# Patient Record
Sex: Female | Born: 1980 | Race: Asian | Hispanic: No | Marital: Married | Smoking: Never smoker
Health system: Southern US, Community
[De-identification: ages and names within clinical notes are randomized; demographics above are authoritative.]

---

## 2014-04-17 LAB — HM PAP SMEAR: HM Pap smear: NEGATIVE

## 2014-08-21 LAB — HM HIV SCREENING LAB: HM HIV Screening: NEGATIVE

## 2019-09-19 ENCOUNTER — Telehealth: Payer: Self-pay | Admitting: Family Medicine

## 2019-09-19 NOTE — Telephone Encounter (Signed)
Patient has appt on 09/21/19 for Nexplanon removal.Kerrigan Gombos, RN

## 2019-09-19 NOTE — Telephone Encounter (Signed)
Patient needs Nex removal and insertion

## 2019-09-21 ENCOUNTER — Ambulatory Visit: Payer: Self-pay

## 2019-09-27 ENCOUNTER — Other Ambulatory Visit: Payer: Self-pay

## 2019-09-27 ENCOUNTER — Ambulatory Visit (LOCAL_COMMUNITY_HEALTH_CENTER): Payer: BLUE CROSS/BLUE SHIELD | Admitting: Family Medicine

## 2019-09-27 VITALS — BP 121/77 | Ht 60.0 in | Wt 133.0 lb

## 2019-09-27 DIAGNOSIS — Z113 Encounter for screening for infections with a predominantly sexual mode of transmission: Secondary | ICD-10-CM

## 2019-09-27 DIAGNOSIS — Z3009 Encounter for other general counseling and advice on contraception: Secondary | ICD-10-CM

## 2019-09-27 DIAGNOSIS — Z3046 Encounter for surveillance of implantable subdermal contraceptive: Secondary | ICD-10-CM

## 2019-09-27 LAB — WET PREP FOR TRICH, YEAST, CLUE
Trichomonas Exam: NEGATIVE
Yeast Exam: NEGATIVE

## 2019-09-27 NOTE — Progress Notes (Signed)
  [place] problem visit  [place] Department  Subjective:  Paula Macdonald is a 38 y.o. being seen today for   Chief Complaint  Patient presents with  . Contraception    Nexplanon removal  . SEXUALLY TRANSMITTED DISEASE    desires STD screening    HPI  Client is here for Nexplanon removal, pap smear and STD screen.  Does the patient have a current or past history of drug use? No   No components found for: HCV]   Health Maintenance Due  Topic Date Due  . TETANUS/TDAP  08/25/1999  . PAP SMEAR-Modifier  04/18/2019  . INFLUENZA VACCINE  07/29/2019    ROS  The following portions of the patient's history were reviewed and updated as appropriate: allergies, current medications, past family history, past medical history, past social history, past surgical history and problem list. Problem list updated.   See flowsheet for other program required questions.  Objective:   Vitals:   09/27/19 1047  BP: 121/77  Weight: 133 lb (60.3 kg)  Height: 5' (1.524 m)    Physical Exam Constitutional:      Appearance: Normal appearance.  Abdominal:     Tenderness: There is no abdominal tenderness.  Genitourinary:    General: Normal vulva.     Vagina: No vaginal discharge.  Skin:    General: Skin is warm and dry.     Findings: No lesion or rash.  Neurological:     Mental Status: She is alert.    Assessment and Plan:  Paula Macdonald is a 38 y.o. female presenting to the [facility] Department for a Women's Health problem visit  1. General counseling and advice on contraceptive management Client will use condoms for birth control - IGP, Aptima HPV  2. Nexplanon removal Nexplanon Removal Patient identified, informed consent performed, consent signed.   Appropriate time out taken. Nexplanon site identified.  Area prepped in usual sterile fashon. 3 ml of 1% lidocaine with Epinephrine was used to anesthetize the area at the distal end of the implant and  along implant site. A small stab incision was made right beside the implant on the distal portion.  The Nexplanon rod was grasped using hemostats and removed without difficulty.  There was minimal blood loss. There were no complications.  Steri-strips were applied over the small incision.  A pressure bandage was applied to reduce any bruising.  The patient tolerated the procedure well and was given post procedure instructions. Counseled patient to take OTC analgesic starting as soon as lidocaine starts to wear off and take regularly for at least 48 hr to decrease discomfort.  Specifically to take with food or milk to decrease stomach upset and for IB 600 mg (3 tablets) every 6 hrs; IB 800 mg (4 tablets) every 8 hrs; or Aleve 2 tablets every 12 hrs.  3. Screening examination for venereal disease  - WET PREP FOR [place], YEAST, CLUE - Chlamydia/Gonorrhea [facility]     No follow-ups on file.  No future appointments.  Hassell Done, FNP

## 2019-09-27 NOTE — Progress Notes (Signed)
Patient here for Nexplanon removal and desires to use condoms as birth control method. Nexplanon placed 06/02/2016. Last physical 04/2016. Last pap 03/2014 and per centricity notes cotest due 03/2019. Patient also desires STD screening. Patient declines blood work today for HIV and syphillis. Last sex was 09/24/2019 with condom. Patient reports no unprotected sex without condoms in the last 7 days.Ronny Bacon, RN

## 2019-09-27 NOTE — Progress Notes (Signed)
Wet mount reviewed, no tx per standing order. °

## 2019-10-03 LAB — IGP, APTIMA HPV
HPV Aptima: NEGATIVE
PAP Smear Comment: 0

## 2020-03-27 ENCOUNTER — Ambulatory Visit: Payer: Self-pay | Attending: Internal Medicine

## 2020-03-27 DIAGNOSIS — Z23 Encounter for immunization: Secondary | ICD-10-CM

## 2020-03-27 NOTE — Progress Notes (Signed)
   Covid-19 Vaccination Clinic  Name:  Paula Macdonald    MRN: [id number] DOB: August 31, 1981  03/27/2020  Paula Macdonald was observed post Covid-19 immunization for 15 minutes without incident. She was provided with Vaccine Information Sheet and instruction to access the V-Safe system.   Paula Macdonald was instructed to call 911 with any severe reactions post vaccine: Marland Kitchen Difficulty breathing  . Swelling of face and throat  . A fast heartbeat  . A bad rash all over body  . Dizziness and weakness   Immunizations Administered    Name Date Dose VIS Date Route   Pfizer COVID-19 Vaccine 03/27/2020  9:09 AM 0.3 mL 12/08/2019 Intramuscular   Manufacturer: [employer], [employer]   Lot: [id number]   NDC: [address]-0

## 2020-04-21 ENCOUNTER — Ambulatory Visit: Payer: Self-pay | Attending: Internal Medicine

## 2020-04-21 DIAGNOSIS — Z23 Encounter for immunization: Secondary | ICD-10-CM

## 2020-04-21 NOTE — Progress Notes (Signed)
   Covid-19 Vaccination Clinic  Name:  Paula Macdonald    MRN: [id number] DOB: 06-22-1981  04/21/2020  [name] was observed post Covid-19 immunization for 15 minutes without incident. She was provided with Vaccine Information Sheet and instruction to access the V-Safe system.   [name] was instructed to call 911 with any severe reactions post vaccine: Marland Kitchen Difficulty breathing  . Swelling of face and throat  . A fast heartbeat  . A bad rash all over body  . Dizziness and weakness   Immunizations Administered    Name Date Dose VIS Date Route   Pfizer COVID-19 Vaccine 04/21/2020  9:00 AM 0.3 mL 02/21/2019 Intramuscular   Manufacturer: [employer], [employer]   Lot: [id number]   NDC: [address]-0

## 2021-05-08 ENCOUNTER — Other Ambulatory Visit: Payer: Self-pay | Admitting: Internal Medicine

## 2021-05-08 DIAGNOSIS — Z1231 Encounter for screening mammogram for malignant neoplasm of breast: Secondary | ICD-10-CM

## 2021-05-13 ENCOUNTER — Ambulatory Visit
Admission: RE | Admit: 2021-05-13 | Discharge: 2021-05-13 | Disposition: A | Discharge status: Home / Self Care | Payer: 59 | Source: Ambulatory Visit | Attending: Internal Medicine | Admitting: Internal Medicine

## 2021-05-13 ENCOUNTER — Other Ambulatory Visit: Payer: Self-pay

## 2021-05-13 DIAGNOSIS — Z1231 Encounter for screening mammogram for malignant neoplasm of breast: Secondary | ICD-10-CM | POA: Diagnosis not present

## 2022-04-30 DIAGNOSIS — Z23 Encounter for immunization: Secondary | ICD-10-CM | POA: Diagnosis not present

## 2022-04-30 DIAGNOSIS — R7309 Other abnormal glucose: Secondary | ICD-10-CM | POA: Diagnosis not present

## 2022-04-30 DIAGNOSIS — Z0000 Encounter for general adult medical examination without abnormal findings: Secondary | ICD-10-CM | POA: Diagnosis not present

## 2022-04-30 DIAGNOSIS — L659 Nonscarring hair loss, unspecified: Secondary | ICD-10-CM | POA: Diagnosis not present

## 2022-04-30 DIAGNOSIS — E538 Deficiency of other specified B group vitamins: Secondary | ICD-10-CM | POA: Diagnosis not present

## 2022-05-07 DIAGNOSIS — R0789 Other chest pain: Secondary | ICD-10-CM | POA: Diagnosis not present

## 2022-05-07 DIAGNOSIS — Z1231 Encounter for screening mammogram for malignant neoplasm of breast: Secondary | ICD-10-CM | POA: Diagnosis not present

## 2022-05-07 DIAGNOSIS — R7989 Other specified abnormal findings of blood chemistry: Secondary | ICD-10-CM | POA: Diagnosis not present

## 2022-05-07 DIAGNOSIS — Z0000 Encounter for general adult medical examination without abnormal findings: Secondary | ICD-10-CM | POA: Diagnosis not present

## 2022-05-07 DIAGNOSIS — Z1331 Encounter for screening for depression: Secondary | ICD-10-CM | POA: Diagnosis not present

## 2022-05-07 DIAGNOSIS — R7309 Other abnormal glucose: Secondary | ICD-10-CM | POA: Diagnosis not present

## 2022-05-07 DIAGNOSIS — R202 Paresthesia of skin: Secondary | ICD-10-CM | POA: Diagnosis not present

## 2022-05-07 DIAGNOSIS — Z23 Encounter for immunization: Secondary | ICD-10-CM | POA: Diagnosis not present

## 2022-05-08 ENCOUNTER — Other Ambulatory Visit: Payer: Self-pay | Admitting: Internal Medicine

## 2022-05-08 DIAGNOSIS — Z1231 Encounter for screening mammogram for malignant neoplasm of breast: Secondary | ICD-10-CM

## 2022-06-09 ENCOUNTER — Ambulatory Visit
Admission: RE | Admit: 2022-06-09 | Discharge: 2022-06-09 | Disposition: A | Discharge status: Home / Self Care | Payer: 59 | Source: Ambulatory Visit | Attending: Internal Medicine | Admitting: Internal Medicine

## 2022-06-09 DIAGNOSIS — Z1231 Encounter for screening mammogram for malignant neoplasm of breast: Secondary | ICD-10-CM | POA: Insufficient documentation

## 2022-10-21 IMAGING — MG MM DIGITAL SCREENING BILAT W/ TOMO AND CAD
6 of 10 series · 6 of 30 positions shown · non-contrast
Comparison: Previous exam(s).

CLINICAL DATA: Screening.

EXAM:
DIGITAL SCREENING BILATERAL MAMMOGRAM WITH TOMOSYNTHESIS AND CAD
TECHNIQUE: Bilateral screening digital craniocaudal and mediolateral oblique
mammograms were obtained. Bilateral screening digital breast
tomosynthesis was performed. The images were evaluated with
computer-aided detection.

[R CC synth-2D]
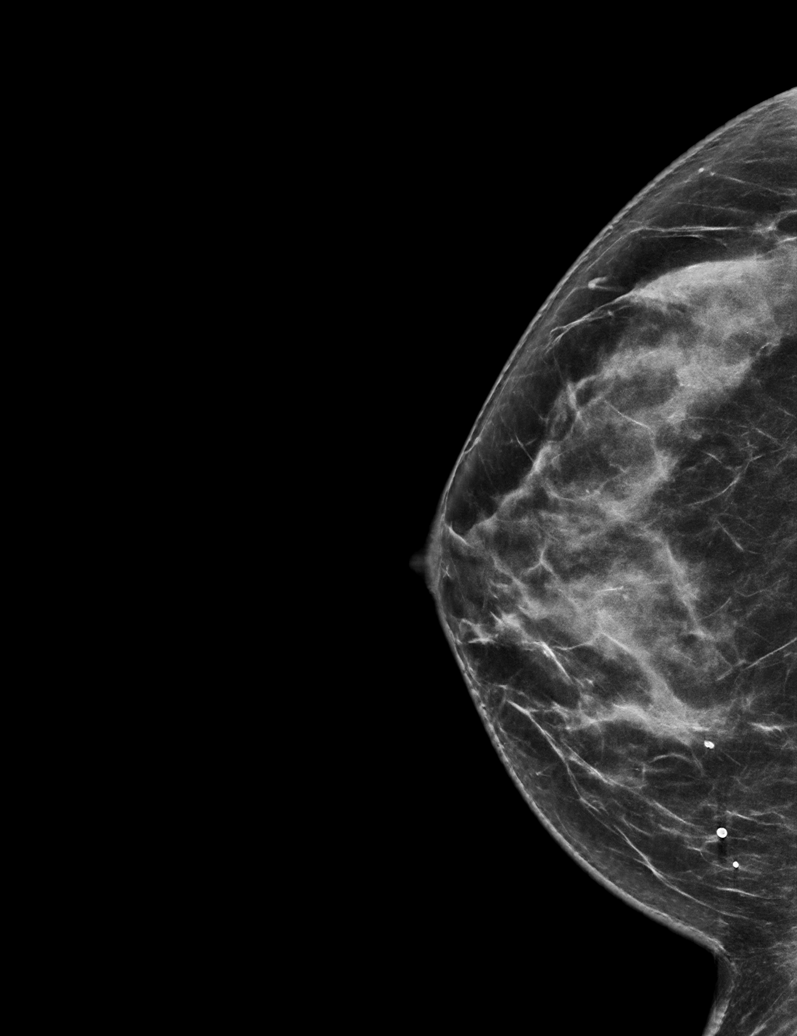

[R MLO synth-2D (1 of 2)]
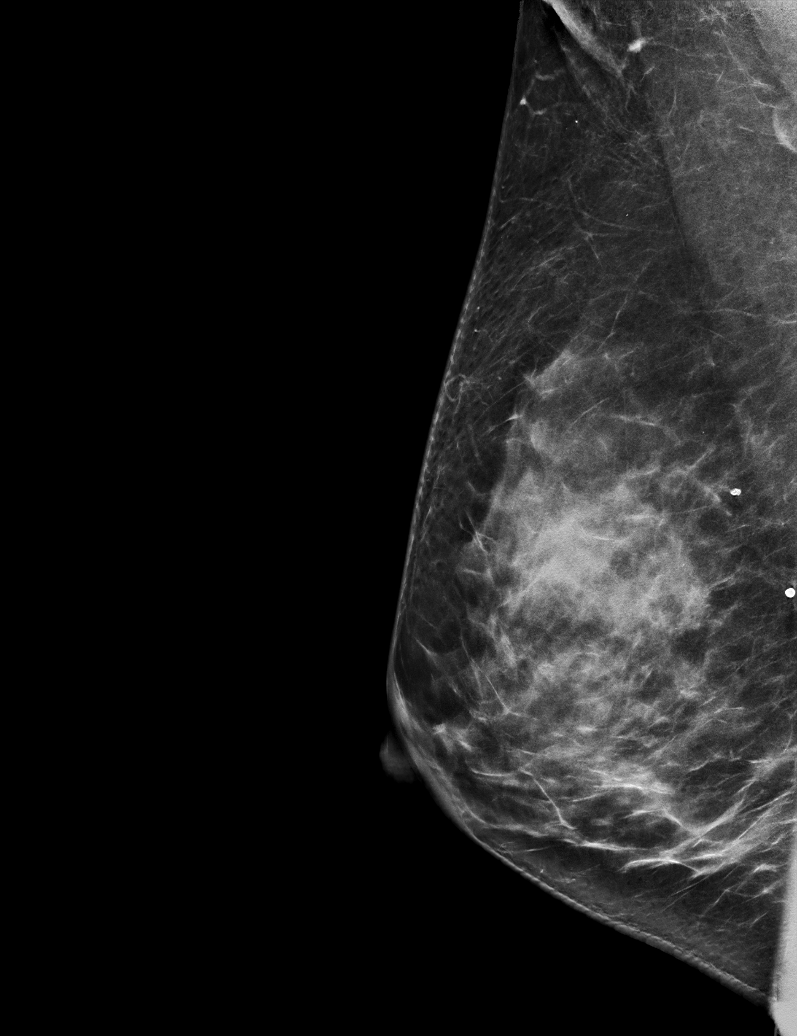

[L MLO synth-2D]
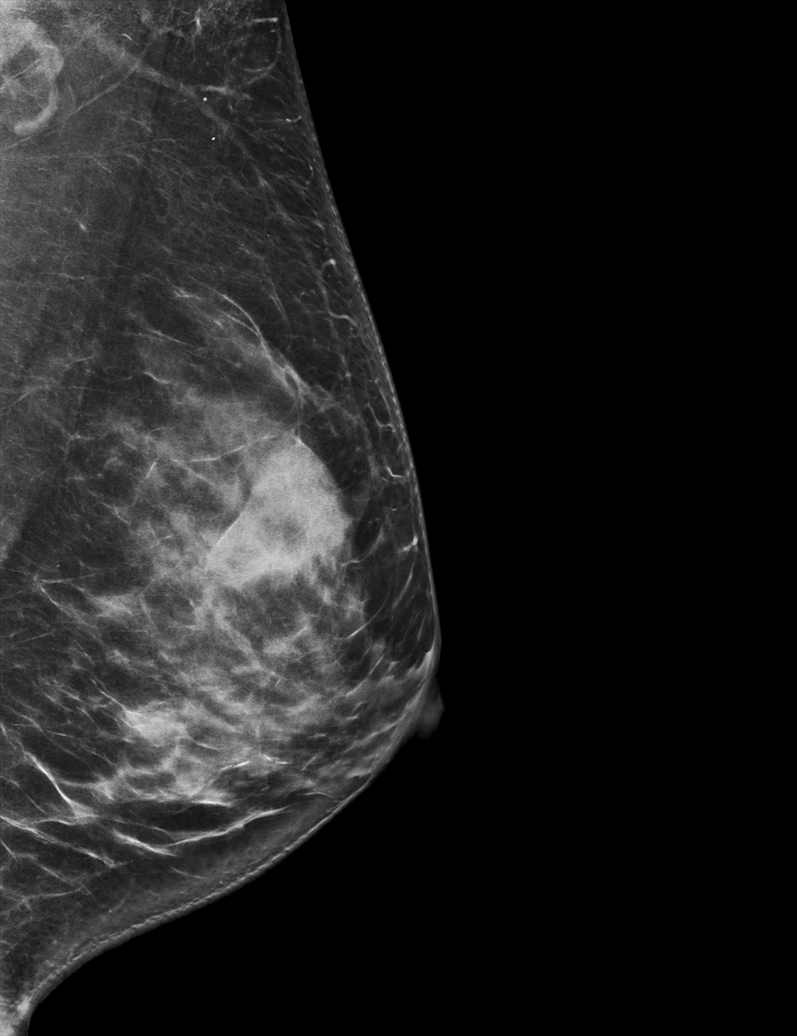

[L CC synth-2D]
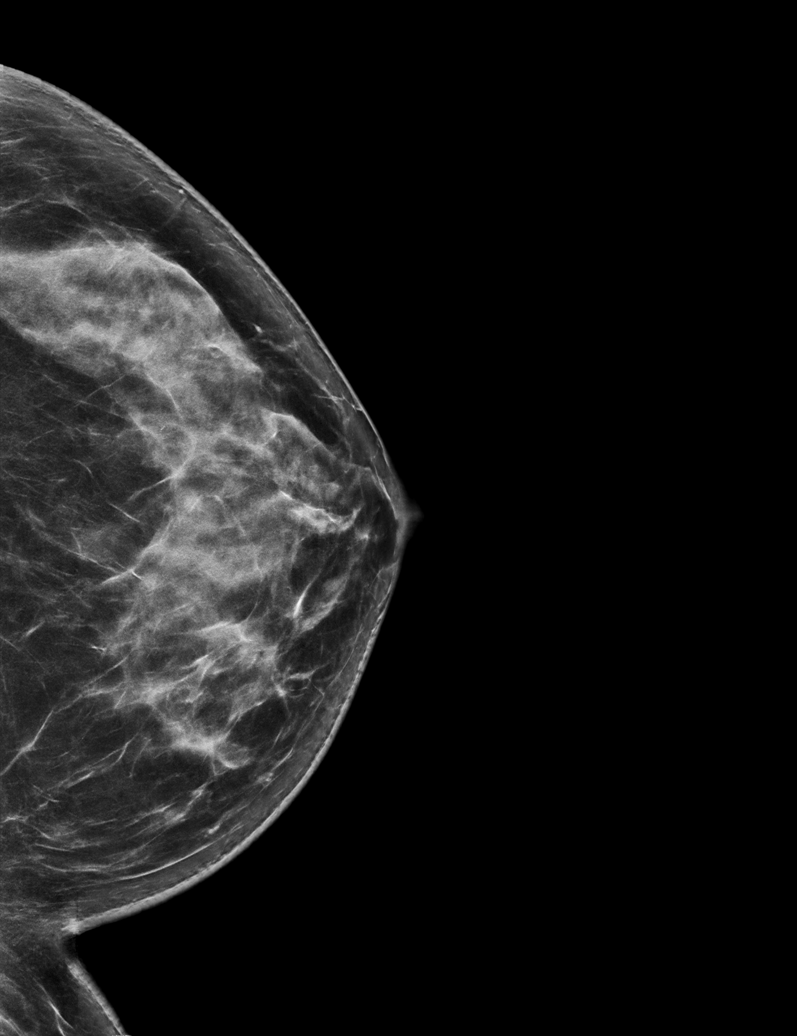

[R MLO synth-2D (2 of 2)]
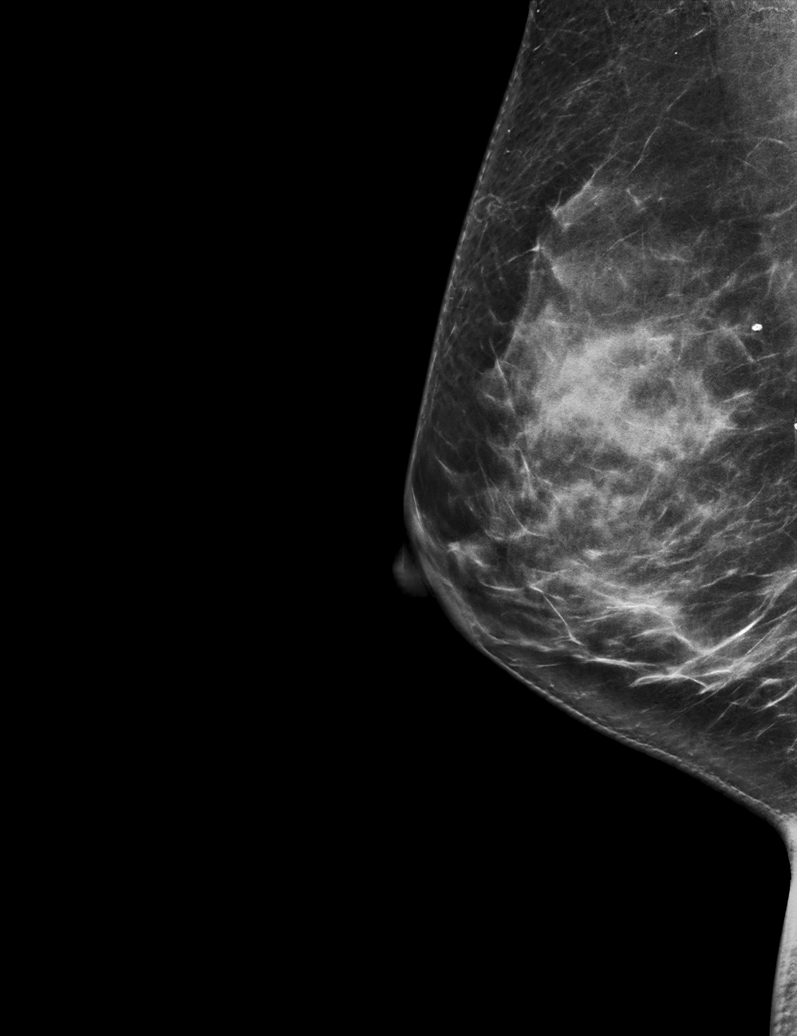

[R CC tomo · tomo slice 38/75.0]
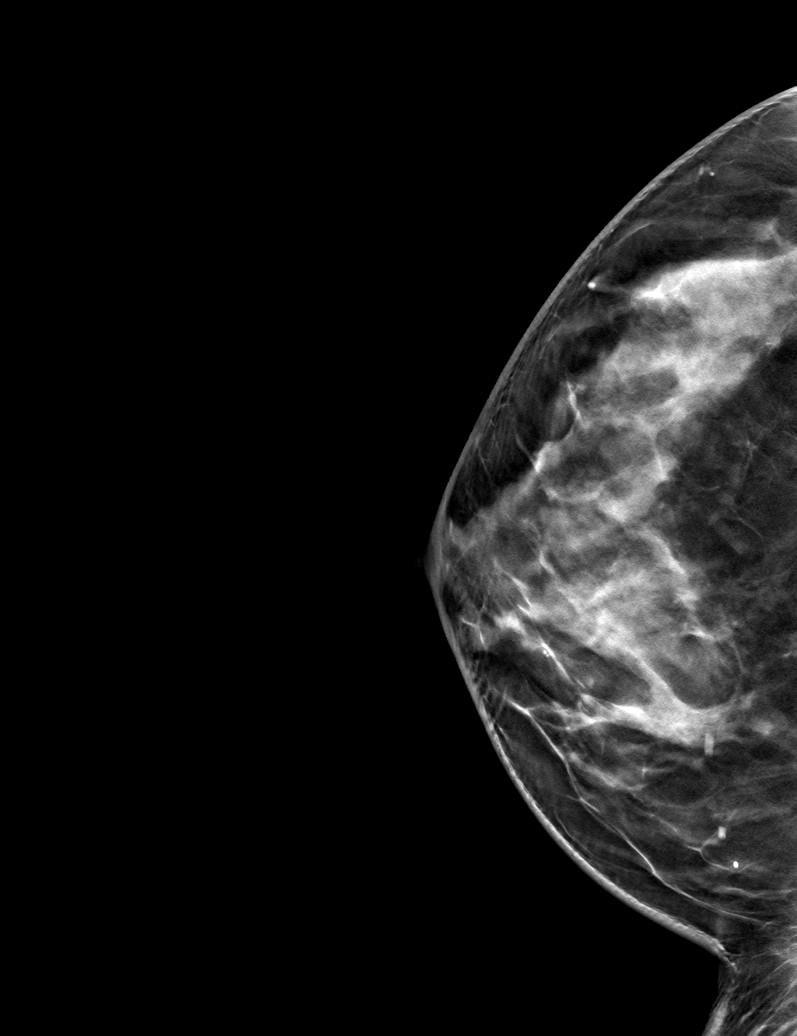

[6 of 30 positions shown; findings below may reference images not displayed]

ACR Breast Density Category c: The breast tissue is heterogeneously
dense, which may obscure small masses.
FINDINGS: There are no findings suspicious for malignancy.
IMPRESSION: No mammographic evidence of malignancy. A result letter of this
screening mammogram will be mailed directly to the patient.

RECOMMENDATION:
Screening mammogram in one year. (Code:[id number])

BI-RADS CATEGORY  1: Negative.

## 2023-05-05 DIAGNOSIS — Z0000 Encounter for general adult medical examination without abnormal findings: Secondary | ICD-10-CM | POA: Diagnosis not present

## 2023-05-05 DIAGNOSIS — R7309 Other abnormal glucose: Secondary | ICD-10-CM | POA: Diagnosis not present

## 2023-05-05 DIAGNOSIS — R7989 Other specified abnormal findings of blood chemistry: Secondary | ICD-10-CM | POA: Diagnosis not present

## 2023-05-05 DIAGNOSIS — R0789 Other chest pain: Secondary | ICD-10-CM | POA: Diagnosis not present

## 2023-05-05 DIAGNOSIS — R202 Paresthesia of skin: Secondary | ICD-10-CM | POA: Diagnosis not present

## 2023-05-12 DIAGNOSIS — Z6826 Body mass index (BMI) 26.0-26.9, adult: Secondary | ICD-10-CM | POA: Diagnosis not present

## 2023-05-12 DIAGNOSIS — N930 Postcoital and contact bleeding: Secondary | ICD-10-CM | POA: Diagnosis not present

## 2023-05-12 DIAGNOSIS — Z1331 Encounter for screening for depression: Secondary | ICD-10-CM | POA: Diagnosis not present

## 2023-05-12 DIAGNOSIS — L299 Pruritus, unspecified: Secondary | ICD-10-CM | POA: Diagnosis not present

## 2023-05-12 DIAGNOSIS — R7989 Other specified abnormal findings of blood chemistry: Secondary | ICD-10-CM | POA: Diagnosis not present

## 2023-05-12 DIAGNOSIS — Z0000 Encounter for general adult medical examination without abnormal findings: Secondary | ICD-10-CM | POA: Diagnosis not present

## 2023-05-12 DIAGNOSIS — R300 Dysuria: Secondary | ICD-10-CM | POA: Diagnosis not present

## 2023-05-25 DIAGNOSIS — Z01419 Encounter for gynecological examination (general) (routine) without abnormal findings: Secondary | ICD-10-CM | POA: Diagnosis not present

## 2023-05-25 DIAGNOSIS — Z1331 Encounter for screening for depression: Secondary | ICD-10-CM | POA: Diagnosis not present

## 2023-05-25 DIAGNOSIS — Z124 Encounter for screening for malignant neoplasm of cervix: Secondary | ICD-10-CM | POA: Diagnosis not present

## 2023-11-17 ENCOUNTER — Other Ambulatory Visit: Payer: Self-pay | Admitting: Internal Medicine

## 2023-11-17 DIAGNOSIS — K219 Gastro-esophageal reflux disease without esophagitis: Secondary | ICD-10-CM

## 2023-11-17 DIAGNOSIS — R1010 Upper abdominal pain, unspecified: Secondary | ICD-10-CM

## 2023-11-18 ENCOUNTER — Ambulatory Visit
Admission: RE | Admit: 2023-11-18 | Discharge: 2023-11-18 | Disposition: A | Discharge status: Home / Self Care | Payer: 59 | Source: Ambulatory Visit | Attending: Internal Medicine | Admitting: Internal Medicine

## 2023-11-18 DIAGNOSIS — K219 Gastro-esophageal reflux disease without esophagitis: Secondary | ICD-10-CM | POA: Diagnosis present

## 2023-11-18 DIAGNOSIS — R1010 Upper abdominal pain, unspecified: Secondary | ICD-10-CM | POA: Insufficient documentation
